# Patient Record
Sex: Female | Born: 2000 | Race: White | Hispanic: No | Marital: Single | Smoking: Current some day smoker
Health system: Southern US, Community
[De-identification: ages and names within clinical notes are randomized; demographics above are authoritative.]

## PROBLEM LIST (undated history)

## (undated) DIAGNOSIS — S329XXA Fracture of unspecified parts of lumbosacral spine and pelvis, initial encounter for closed fracture: Secondary | ICD-10-CM

## (undated) DIAGNOSIS — F419 Anxiety disorder, unspecified: Secondary | ICD-10-CM

## (undated) DIAGNOSIS — F32A Depression, unspecified: Secondary | ICD-10-CM

---

## 2021-06-09 ENCOUNTER — Other Ambulatory Visit: Payer: Self-pay

## 2021-06-09 ENCOUNTER — Inpatient Hospital Stay (HOSPITAL_COMMUNITY)
Admission: AD | Admit: 2021-06-09 | Discharge: 2021-06-09 | Disposition: A | Discharge status: Home / Self Care | Payer: Medicaid Other | Attending: Obstetrics and Gynecology | Admitting: Obstetrics and Gynecology

## 2021-06-09 ENCOUNTER — Inpatient Hospital Stay (HOSPITAL_COMMUNITY): Payer: Medicaid Other

## 2021-06-09 ENCOUNTER — Encounter (HOSPITAL_COMMUNITY): Payer: Self-pay | Admitting: Obstetrics and Gynecology

## 2021-06-09 DIAGNOSIS — O23591 Infection of other part of genital tract in pregnancy, first trimester: Secondary | ICD-10-CM | POA: Insufficient documentation

## 2021-06-09 DIAGNOSIS — R109 Unspecified abdominal pain: Secondary | ICD-10-CM | POA: Diagnosis not present

## 2021-06-09 DIAGNOSIS — B9689 Other specified bacterial agents as the cause of diseases classified elsewhere: Secondary | ICD-10-CM

## 2021-06-09 DIAGNOSIS — N760 Acute vaginitis: Secondary | ICD-10-CM

## 2021-06-09 DIAGNOSIS — O99331 Smoking (tobacco) complicating pregnancy, first trimester: Secondary | ICD-10-CM | POA: Insufficient documentation

## 2021-06-09 DIAGNOSIS — O26899 Other specified pregnancy related conditions, unspecified trimester: Secondary | ICD-10-CM

## 2021-06-09 DIAGNOSIS — O99011 Anemia complicating pregnancy, first trimester: Secondary | ICD-10-CM | POA: Diagnosis not present

## 2021-06-09 DIAGNOSIS — Z3A09 9 weeks gestation of pregnancy: Secondary | ICD-10-CM | POA: Insufficient documentation

## 2021-06-09 DIAGNOSIS — D649 Anemia, unspecified: Secondary | ICD-10-CM

## 2021-06-09 DIAGNOSIS — O26891 Other specified pregnancy related conditions, first trimester: Secondary | ICD-10-CM | POA: Diagnosis not present

## 2021-06-09 DIAGNOSIS — F17210 Nicotine dependence, cigarettes, uncomplicated: Secondary | ICD-10-CM | POA: Diagnosis not present

## 2021-06-09 DIAGNOSIS — A749 Chlamydial infection, unspecified: Secondary | ICD-10-CM

## 2021-06-09 DIAGNOSIS — R1032 Left lower quadrant pain: Secondary | ICD-10-CM | POA: Insufficient documentation

## 2021-06-09 DIAGNOSIS — O98819 Other maternal infectious and parasitic diseases complicating pregnancy, unspecified trimester: Secondary | ICD-10-CM

## 2021-06-09 DIAGNOSIS — Z3A01 Less than 8 weeks gestation of pregnancy: Secondary | ICD-10-CM

## 2021-06-09 HISTORY — DX: Fracture of unspecified parts of lumbosacral spine and pelvis, initial encounter for closed fracture: S32.9XXA

## 2021-06-09 HISTORY — DX: Depression, unspecified: F32.A

## 2021-06-09 HISTORY — DX: Anxiety disorder, unspecified: F41.9

## 2021-06-09 LAB — HCG, QUANTITATIVE, PREGNANCY: hCG, Beta Chain, Quant, S: 178530 m[IU]/mL — ABNORMAL HIGH (ref <5)

## 2021-06-09 LAB — URINALYSIS, ROUTINE W REFLEX MICROSCOPIC
Bacteria, UA: NONE SEEN
Bilirubin Urine: NEGATIVE
Glucose, UA: NEGATIVE mg/dL
Hgb urine dipstick: NEGATIVE
Ketones, ur: NEGATIVE mg/dL
Nitrite: NEGATIVE
Protein, ur: NEGATIVE mg/dL
Specific Gravity, Urine: 1.024 (ref 1.005–1.030)
pH: 6 (ref 5.0–8.0)

## 2021-06-09 LAB — CBC
HCT: 33.3 % — ABNORMAL LOW (ref 36.0–46.0)
Hemoglobin: 10.6 g/dL — ABNORMAL LOW (ref 12.0–15.0)
MCH: 24.9 pg — ABNORMAL LOW (ref 26.0–34.0)
MCHC: 31.8 g/dL (ref 30.0–36.0)
MCV: 78.2 fL — ABNORMAL LOW (ref 80.0–100.0)
Platelets: 304 10*3/uL (ref 150–400)
RBC: 4.26 MIL/uL (ref 3.87–5.11)
RDW: 14.7 % (ref 11.5–15.5)
WBC: 7.3 10*3/uL (ref 4.0–10.5)
nRBC: 0 % (ref 0.0–0.2)

## 2021-06-09 LAB — POCT PREGNANCY, URINE: Preg Test, Ur: POSITIVE — AB

## 2021-06-09 LAB — WET PREP, GENITAL
Sperm: NONE SEEN
Trich, Wet Prep: NONE SEEN
Yeast Wet Prep HPF POC: NONE SEEN

## 2021-06-09 LAB — ABO/RH: ABO/RH(D): O POS

## 2021-06-09 MED ORDER — METRONIDAZOLE 500 MG PO TABS: 500.0000 mg | ORAL_TABLET | Freq: Two times a day (BID) | ORAL | 0 refills | Status: AC

## 2021-06-09 MED ORDER — PRENATAL VITAMIN 27-0.8 MG PO TABS: 1.0000 | ORAL_TABLET | Freq: Every day | ORAL | 10 refills | Status: AC

## 2021-06-09 NOTE — MAU Note (Signed)
Erin Phillips is a 20 y.o. here in MAU reporting: thinks she is pregnant again, states she has not taken a pregnancy test at home yet. Having some pain in her left lower abdomen for the past few days. No bleeding. Having some discharge.  LMP: 04/05/21  Onset of complaint: ongoing  Pain score: 6/10  Vitals:   06/09/21 1335  BP: (!) 101/59  Pulse: 81  Resp: 16  Temp: 98.1 F (36.7 C)  SpO2: 100%     Lab orders placed from triage: upt

## 2021-06-09 NOTE — MAU Provider Note (Signed)
History     CSN: [id number]  Arrival date and time: 06/09/21 1304   Event Date/Time   First Provider Initiated Contact with Patient 06/09/21 1400      Chief Complaint  Patient presents with   Abdominal Pain   20 y.o. [id number] @[redacted]w[redacted]d  by unsure LMP presenting with LLQ pain. Pain started a few days ago. Pain is intermittent. Pain usually happens when she lies down on her left side. Not having pain currently. Denies VB. Reports thin creamy malodorous vaginal discharge. Also reports painful IC. New sexual partner in the last year. Reports some morning sickness.   OB History     Gravida  3   Para  2   Term  1   Preterm  1   AB  0   Living  1      SAB      IAB  0   Ectopic      Multiple      Live Births  1           Past Medical History:  Diagnosis Date   Anxiety    Depression    Pelvis fracture (HCC)     Past Surgical History:  Procedure Laterality Date   CESAREAN SECTION      History reviewed. No pertinent family history.  Social History   Tobacco Use   Smoking status: Some Days    Types: Cigarettes  Vaping Use   Vaping Use: Every Fantini  Substance Use Topics   Alcohol use: Not Currently   Drug use: Yes    Types: Marijuana    Allergies:  Allergies  Allergen Reactions   Bee Venom Anaphylaxis   Latex Rash   Lavender Oil Shortness Of Breath    No medications prior to admission.    Review of Systems  Constitutional:  Negative for chills and fever.  Gastrointestinal:  Positive for abdominal pain, nausea and vomiting. Negative for constipation and diarrhea.  Genitourinary:  Positive for vaginal discharge. Negative for dysuria, frequency, hematuria, urgency and vaginal bleeding.  Physical Exam   Blood pressure 110/62, pulse 68, temperature 98.1 F (36.7 C), temperature source Oral, resp. rate 18, height 5\' 4"  (1.626 m), weight 61.4 kg, last menstrual period 04/05/2021, SpO2 100 %.  Physical Exam Vitals and nursing note reviewed.   Constitutional:      General: She is not in acute distress.    Appearance: Normal appearance.  HENT:     Head: Normocephalic and atraumatic.  Pulmonary:     Effort: Pulmonary effort is normal. No respiratory distress.  Abdominal:     General: There is no distension.     Palpations: Abdomen is soft. There is no mass.     Tenderness: There is no abdominal tenderness. There is no guarding or rebound.     Hernia: No hernia is present.  Musculoskeletal:        General: Normal range of motion.     Cervical back: Normal range of motion.  Skin:    General: Skin is warm and dry.  Neurological:     General: No focal deficit present.     Mental Status: She is alert and oriented to person, place, and time.  Psychiatric:        Mood and Affect: Mood normal.        Behavior: Behavior normal.   Results for orders placed or performed during the hospital encounter of 06/09/21 (from the past 24 hour(s))  Pregnancy, urine POC  Status: Abnormal   Collection Time: 06/09/21  1:33 PM  Result Value Ref Range   Preg Test, Ur POSITIVE (A) NEGATIVE  Urinalysis, Routine w reflex microscopic     Status: Abnormal   Collection Time: 06/09/21  1:34 PM  Result Value Ref Range   Color, Urine YELLOW YELLOW   APPearance HAZY (A) CLEAR   Specific Gravity, Urine 1.024 1.005 - 1.030   pH 6.0 5.0 - 8.0   Glucose, UA NEGATIVE NEGATIVE mg/dL   Hgb urine dipstick NEGATIVE NEGATIVE   Bilirubin Urine NEGATIVE NEGATIVE   Ketones, ur NEGATIVE NEGATIVE mg/dL   Protein, ur NEGATIVE NEGATIVE mg/dL   Nitrite NEGATIVE NEGATIVE   Leukocytes,Ua TRACE (A) NEGATIVE   WBC, UA 0-5 0 - 5 WBC/hpf   Bacteria, UA NONE SEEN NONE SEEN   Squamous Epithelial / LPF 0-5 0 - 5   Mucus PRESENT   Wet prep, genital     Status: Abnormal   Collection Time: 06/09/21  2:10 PM   Specimen: Vaginal  Result Value Ref Range   Yeast Wet Prep HPF POC NONE SEEN NONE SEEN   Trich, Wet Prep NONE SEEN NONE SEEN   Clue Cells Wet Prep HPF POC  PRESENT (A) NONE SEEN   WBC, Wet Prep HPF POC MANY (A) NONE SEEN   Sperm NONE SEEN   ABO/Rh     Status: None   Collection Time: 06/09/21  2:15 PM  Result Value Ref Range   ABO/RH(D) O POS    No rh immune globuloin      NOT A RH IMMUNE GLOBULIN CANDIDATE, PT RH POSITIVE Performed at [facility], [address] [place], [place] [address]   CBC     Status: Abnormal   Collection Time: 06/09/21  2:15 PM  Result Value Ref Range   WBC 7.3 4.0 - 10.5 K/uL   RBC 4.26 3.87 - 5.11 MIL/uL   Hemoglobin 10.6 (L) 12.0 - 15.0 g/dL   HCT 73.5 (L) 32.9 - 92.4 %   MCV 78.2 (L) 80.0 - 100.0 fL   MCH 24.9 (L) 26.0 - 34.0 pg   MCHC 31.8 30.0 - 36.0 g/dL   RDW 26.8 34.1 - 96.2 %   Platelets 304 150 - 400 K/uL   nRBC 0.0 0.0 - 0.2 %  hCG, quantitative, pregnancy     Status: Abnormal   Collection Time: 06/09/21  2:15 PM  Result Value Ref Range   hCG, Beta Chain, Quant, S 178,530 (H) <5 mIU/mL   US OB Comp Less 14 Wks  Result Date: 06/09/2021 CLINICAL DATA:  Abdominal pain. Pregnant patient. Patient is 9 weeks and 2 days pregnant based on her last menstrual period. EXAM: OBSTETRIC <14 WK ULTRASOUND TECHNIQUE: Transabdominal ultrasound was performed for evaluation of the gestation as well as the maternal uterus and adnexal regions. COMPARISON:  None. FINDINGS: Intrauterine gestational sac: Single Yolk sac:  Visualized. Embryo:  Visualized. Cardiac Activity: Visualized. Heart Rate: 143 bpm CRL:   8.3 mm   6 w 5 d                  [place] EDC: 01/28/2022 Subchorionic hemorrhage:  None visualized. Maternal uterus/adnexae: No uterine masses. Cervix is unremarkable. Normal ovaries and adnexa. No pelvic free fluid. IMPRESSION: 1. Single live intrauterine pregnancy with a measured gestational age of [redacted] weeks and 5 days. No pregnancy complication. Normal ovaries and adnexa. Electronically Signed   By: Amie Portland M.D.   On: 06/09/2021 15:09  MAU Course  Procedures  MDM Labs and [place] ordered and reviewed. No  acute process identified. Viable IUP on [place]. Will treat for BV, GC pending. Stable for discharge.  Assessment and Plan   1. [redacted] weeks gestation of pregnancy   2. Abdominal pain affecting pregnancy   3. Anemia during pregnancy in first trimester   4. Bacterial vaginosis    Discharge home Follow up with OB provider of choice to start care Rx Flagyl Rx PNV SAB precautions OB list provided  Allergies as of 06/09/2021       Reactions   Bee Venom Anaphylaxis   Latex Rash   Lavender Oil Shortness Of Breath        Medication List     TAKE these medications    metroNIDAZOLE 500 MG tablet Commonly known as: FLAGYL Take 1 tablet (500 mg total) by mouth 2 (two) times daily.   Prenatal Vitamin 27-0.8 MG Tabs Take 1 tablet by mouth daily.       Donette Larry, CNM 06/09/2021, 4:02 PM

## 2021-06-09 NOTE — Discharge Instructions (Addendum)
[facility] [address] [facility] [employer] for [employer] at [employer] [facility]             [address] [place], [place] [address] [phone]  Center for [employer] at [facility]                                                             [address], [place], [place], [address] [phone]  Center [facility] [place] [address], [place], [place], [address] [phone]  Center for [facility] at [facility] [address], [place], [place], [address] [phone]  Center for [facility]                                 [address] [place], [place], [place], [address] [phone]  Center for [facility]                                    [address], [place], [place], [address] [phone]  Center for [facility] at [facility] [address], [place], [place], [address]                              [facility] [address], [place], [place], [address] [phone]  [facility]         Phone: [phone]  [facility]      Phone: [phone]   [facility]      Phone: [phone]  [facility]         Phone: [phone]   [facility]-M[facility]    Phone: [phone]  [facility]      Phone: [phone]  [facility]     Phone: [phone]  [facility]        Phone: [phone]  [facility]      Phone: [phone]    AREA PEDIATRIC/FAMILY PRACTICE PHYSICIANS  [facility] [place] [address] [place], [place] [address] Phone - [phone]   Fax - [phone]  [facility] [address] [place], [place]  [address] Phone  - [phone]   Fax - [phone]  [facility] [address] [place], [place]  [address] Phone - [phone]   Fax - [phone]  [facility] [place] [address] [place], [place]  [address] Phone - [phone]   Fax - [phone]  [facility] [address] [place], [place]  [address] Phone - [phone]   Fax - [phone]  [facility] [address] [place], [place]  [address] Phone - [phone]   Fax - [phone]  [facility] [place] [address] [place], [place]  [address] Phone - [phone]   Fax - [phone]  [facility] AT [facility] [address] [place], [address] [place], [place]  [address] Phone - [phone]   Fax - [phone]  [facility] AT [facility] [address] [place], [place]  [address] Phone - [phone]   Fax - [phone] [facility] AT [place] [address] [place], [place]  [address] Phone - [phone]   Fax - [phone]  [facility] AT [facility] [address] [place], [place]  [address] Phone - [phone]   Fax - [phone]  [facility] AT [facility] [address], [address] [place], [place]  [address] Phone - [phone]   Fax - [phone]  [facility] [place] [address] [place], [place]  [address] Phone - [phone]   Fax - [phone]  [facility] [address] [place], [place]  [address] Phone - [phone]  [facility] [address] [place], [place]  [address] Phone - [phone]   Fax - [phone]  [facility] [address] [place], [place]  [address] Phone - [phone]   Fax - [phone]  [facility] [address] [place], [place]  [address] Phone - [phone]   Fax - [phone]  [facility] [address] [place], [place]  [address] Phone - [phone]   Fax - [phone]   [facility] [address] [place], [place]  [address] Phone - [phone]   Fax - [phone]  [facility] [address] [place], [place]  [address] Phone - [phone]   Fax - [phone]  DA[facility]ID RUBIN [address] [place], [place]  [address] Phone - [phone]   Fax - [phone]  [facility] [address] [place], [place]  [address] Phone - [phone]   Fax - [phone]  [place] [facility] Alita Chyle [address] [place], [place]  [address] Phone - [phone]   Fax - [phone] Gerarda Fraction [address] [place], [place]  [address] Phone - [phone]   Fax - [phone]  [facility] [address] [place], [place]  [address] Phone - [phone]   Fax - [phone]  [facility] - [place] [address] [place], [place]  [address] Phone - [phone]   Fax - [phone]

## 2021-06-10 LAB — GC/CHLAMYDIA PROBE AMP (~~LOC~~) NOT AT ARMC
Chlamydia: POSITIVE — AB
Comment: NEGATIVE
Comment: NORMAL
Neisseria Gonorrhea: NEGATIVE

## 2021-06-12 ENCOUNTER — Other Ambulatory Visit: Payer: Self-pay | Admitting: Obstetrics and Gynecology

## 2021-06-12 MED ORDER — AZITHROMYCIN 500 MG PO TABS
1000.0000 mg | ORAL_TABLET | Freq: Once | ORAL | 0 refills | Status: AC
Start: 2021-06-12 — End: 2021-06-12

## 2021-06-12 NOTE — Progress Notes (Signed)
+  chlamydia Rx: azithromycin    Duane Lope, NP 06/12/2021 5:05 PM

## 2021-06-13 DIAGNOSIS — A749 Chlamydial infection, unspecified: Secondary | ICD-10-CM

## 2022-08-14 IMAGING — US US OB COMP LESS 14 WK
1 series · 15 of 28 positions shown · non-contrast
Comparison: None.

CLINICAL DATA: Abdominal pain. Pregnant patient. Patient is 9 weeks
and 2 days pregnant based on her last menstrual period.

EXAM:
OBSTETRIC <14 WK ULTRASOUND
TECHNIQUE: Transabdominal ultrasound was performed for evaluation of the
gestation as well as the maternal uterus and adnexal regions.

[Series 1: us ob comp less 14 wk · 34 acquisitions, 15 frames shown]
[im 1/34]
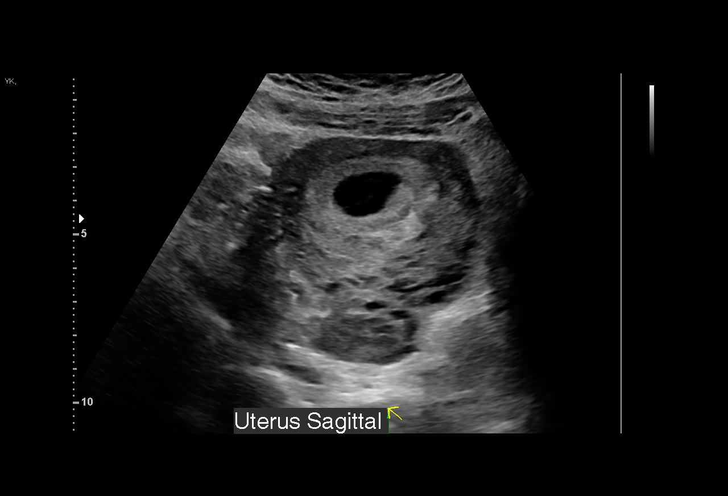
[im 3/34]
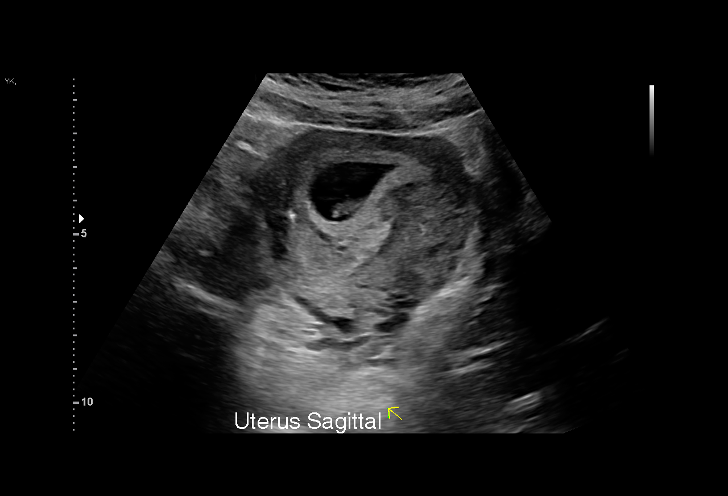
[im 5/34]
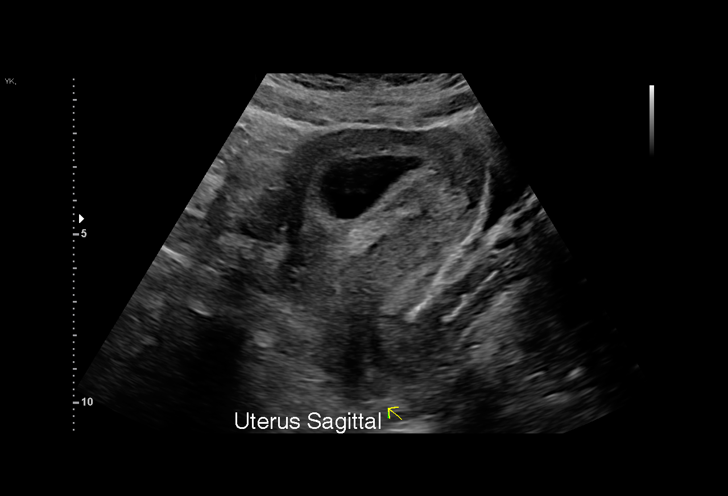
[im 8/34]
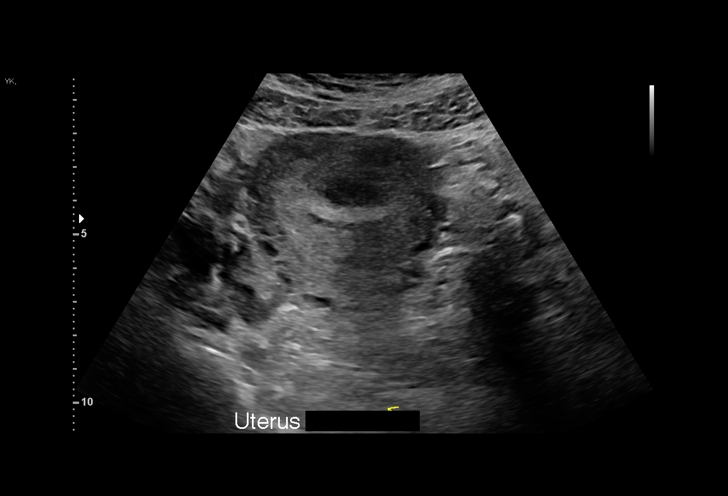
[im 10/34]
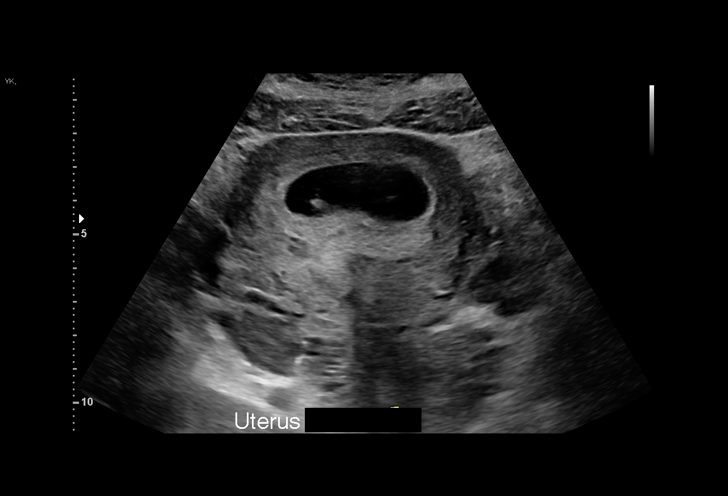
[im 13/34]
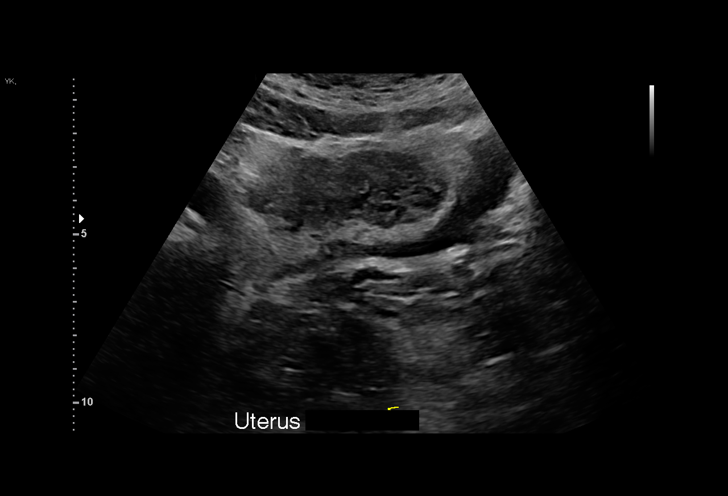
[im 15/34]
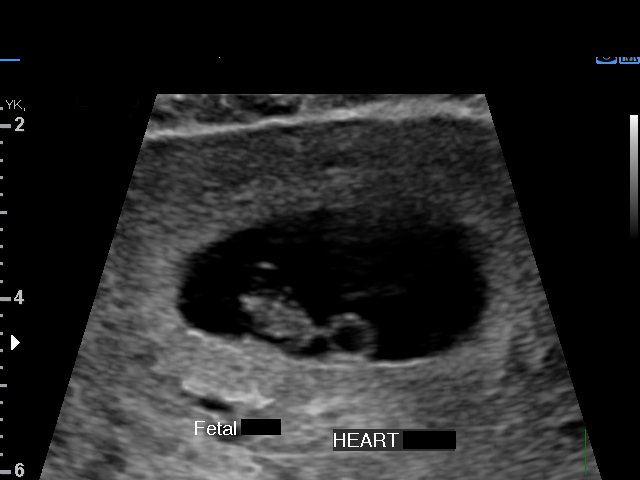
[im 18/34]
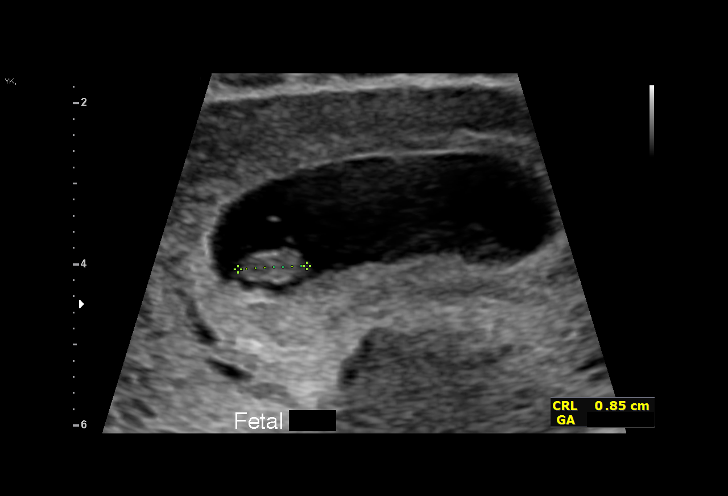
[im 19/34]
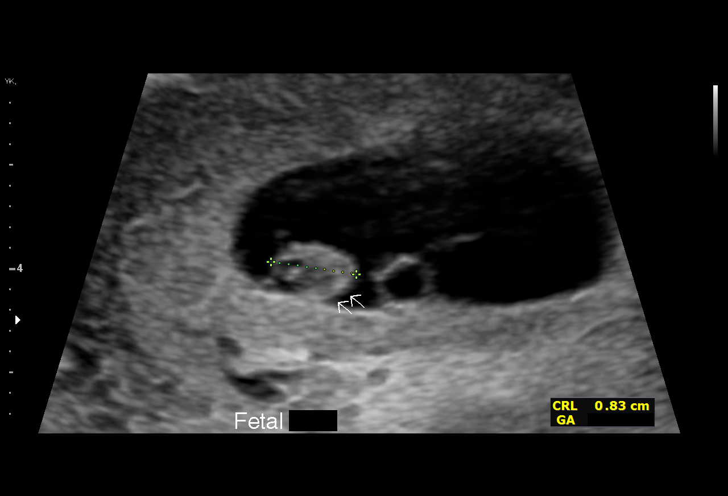
[im 21/34]
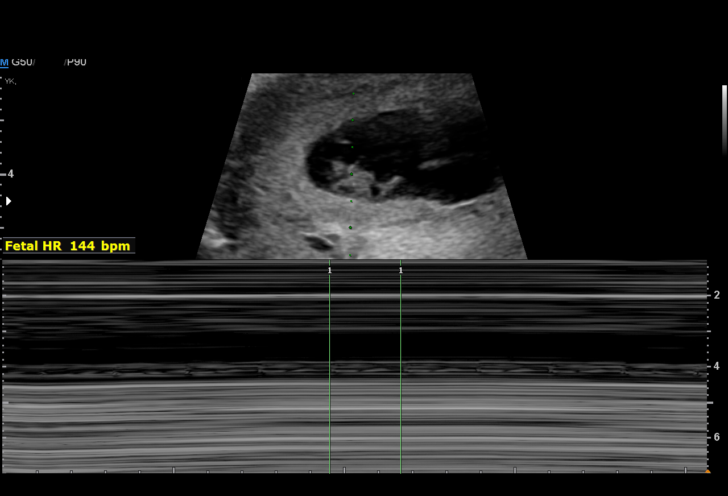
[im 24/34]
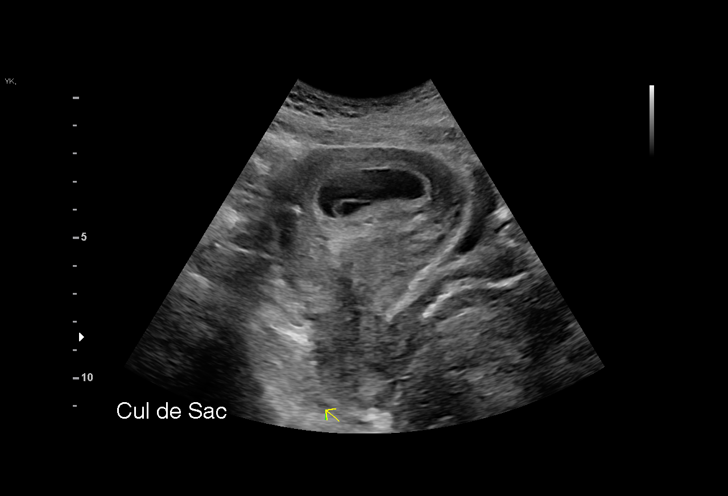
[im 26/34]
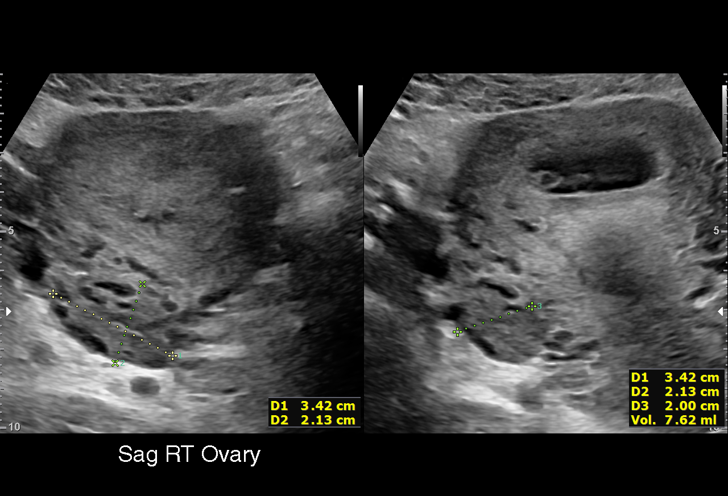
[im 29/34]
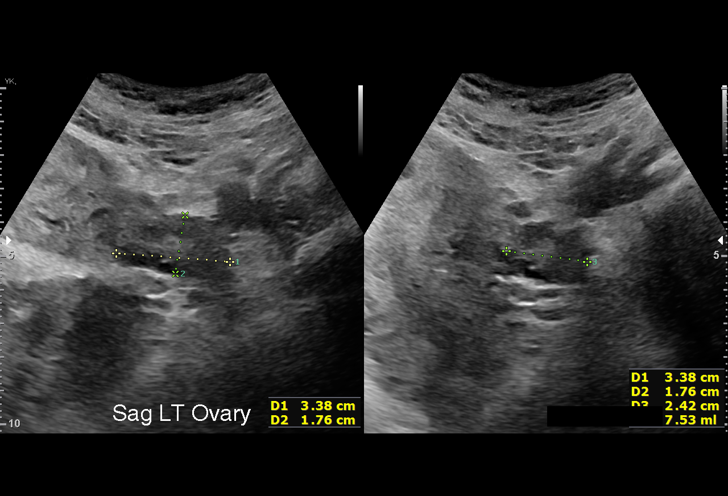
[im 31/34]
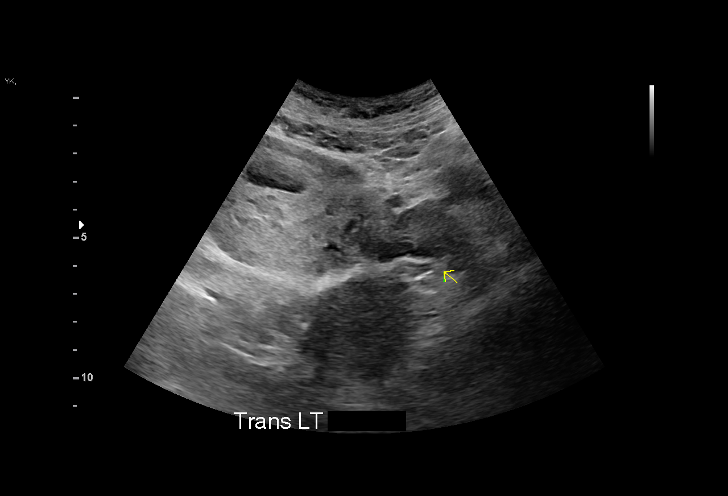
[im 34/34]
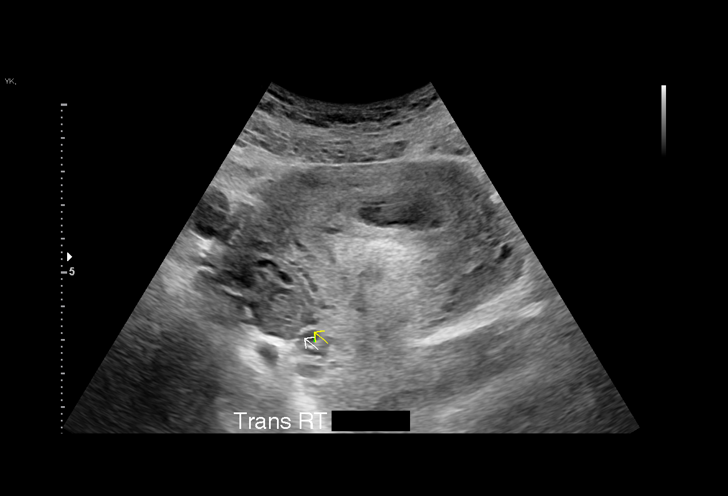

[15 of 28 positions shown; findings below may reference images not displayed]

FINDINGS: Intrauterine gestational sac: Single

Yolk sac:  Visualized.

Embryo:  Visualized.

Cardiac Activity: Visualized.

Heart Rate: 143 bpm

CRL:   8.3 mm   6 w 5 d                  US EDC: 01/28/2022

Subchorionic hemorrhage:  None visualized.

Maternal uterus/adnexae: No uterine masses. Cervix is unremarkable.
Normal ovaries and adnexa. No pelvic free fluid.
IMPRESSION: 1. Single live intrauterine pregnancy with a measured gestational
age of 6 weeks and 5 days. No pregnancy complication. Normal ovaries
and adnexa.
